# Patient Record
Sex: Female | Born: 1960 | Race: White | Hispanic: No | Marital: Married | Smoking: Never smoker
Health system: Southern US, Community
[De-identification: ages and names within clinical notes are randomized; demographics above are authoritative.]

## PROBLEM LIST (undated history)

## (undated) DIAGNOSIS — E785 Hyperlipidemia, unspecified: Secondary | ICD-10-CM

## (undated) DIAGNOSIS — F329 Major depressive disorder, single episode, unspecified: Secondary | ICD-10-CM

## (undated) DIAGNOSIS — F419 Anxiety disorder, unspecified: Secondary | ICD-10-CM

## (undated) DIAGNOSIS — I10 Essential (primary) hypertension: Secondary | ICD-10-CM

## (undated) DIAGNOSIS — F32A Depression, unspecified: Secondary | ICD-10-CM

## (undated) HISTORY — DX: Depression, unspecified: F32.A

## (undated) HISTORY — DX: Hyperlipidemia, unspecified: E78.5

## (undated) HISTORY — DX: Essential (primary) hypertension: I10

## (undated) HISTORY — DX: Anxiety disorder, unspecified: F41.9

## (undated) HISTORY — DX: Major depressive disorder, single episode, unspecified: F32.9

---

## 1984-10-03 HISTORY — PX: TUBAL LIGATION: SHX77

## 1986-10-03 HISTORY — PX: ABDOMINAL HYSTERECTOMY: SHX81

## 2005-08-03 ENCOUNTER — Ambulatory Visit: Payer: Self-pay | Admitting: Family Medicine

## 2006-08-07 ENCOUNTER — Ambulatory Visit: Payer: Self-pay | Admitting: Family Medicine

## 2007-11-13 ENCOUNTER — Ambulatory Visit: Payer: Self-pay | Admitting: Family Medicine

## 2007-11-13 ENCOUNTER — Ambulatory Visit: Payer: Self-pay | Admitting: Gastroenterology

## 2007-12-31 ENCOUNTER — Ambulatory Visit: Payer: Self-pay | Admitting: Gastroenterology

## 2009-01-07 ENCOUNTER — Ambulatory Visit: Payer: Self-pay | Admitting: Family Medicine

## 2011-02-15 NOTE — Assessment & Plan Note (Signed)
[facility]                         GASTROENTEROLOGY OFFICE NOTE   MATRACA, HUNKINS                         MRN:          [id number]  DATE:11/13/2007                            DOB:          Jun 15, 1961    REFERRING PHYSICIAN:  [name]   REASON FOR CONSULTATION:  Family history of colorectal CA.   HPI:  Katie [name] is a pleasant, 50 year old white female, referred through  the courtesy of Dr. Burnett Sheng for evaluation.  Family history is pertinent  for her father, who developed colon cancer.  She last underwent  screening colonoscopy in 2003, which was entirely normal.  Katie [name] has  no GI complaints, including change in bowel habits, abdominal pain,  melena or hematochezia.  Lab is pertinent for a microcytic anemia.  On  October 11, 2007, her hemoglobin was 11, MCV 80.5.  She denies melena or  hematochezia or vaginal blood loss.  She underwent hysterectomy 20 years  ago.  She denies use of nonsteroidals or aspirin.   PAST MEDICAL HISTORY:  Pertinent for hypertension and depression.  She  is status post hysterectomy and tubal ligation.   FAMILY HISTORY:  As stated above.   MEDICATIONS:  Include Caduet and Effexor.   ALLERGIES:  She has no allergies.   SOCIAL HISTORY:  She neither smokes nor drinks.  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   REVIEW OF SYSTEMS:  Negative.   PHYSICAL EXAMINATION:  She is a healthy-appearing female.  Pulse 80, blood pressure 110/74, weight 142.  HEENT: EOMI.  PERRLA.  Sclerae are anicteric.  Conjunctivae are pink.  NECK:  Supple without thyromegaly, adenopathy or carotid bruits.  CHEST:  Clear to auscultation and percussion without adventitious  sounds.  CARDIAC:  Regular rhythm; normal S1 S2.  There are no murmurs, gallops  or rubs.  ABDOMEN:  Bowel sounds are normoactive.  Abdomen is soft, nontender and  nondistended.  There are no abdominal masses, tenderness, splenic  enlargement or hepatomegaly.  EXTREMITIES:  Full range of motion.  No cyanosis, clubbing or edema.  RECTAL:  Deferred.   IMPRESSION:  1. Family history of colon cancer.  2. Microcytic anemia, probably iron deficiency.   RECOMMENDATION:  1. Colonoscopy.  2. If no source for GI bleeding is seen by colonoscopy, I will obtain      stool Hemoccults.     Barbette Hair. Arlyce Dice, MD,FACG  Electronically Signed    RDK/MedQ  DD: 11/13/2007  DT: 11/14/2007  Job #: [id number]   cc:   [name]

## 2011-02-15 NOTE — Letter (Signed)
November 13, 2007    [name]  [address] [place]  [place], [place] [address]   RE:  Katie [name], Katie [name]  MRN:  [id number]  /  DOB:  [birth year]   Dear Dr. Burnett Sheng:   Upon your kind referral, I had the pleasure of evaluating your patient  and I am pleased to offer my findings.  I saw Katie [name] in the office  today.  Enclosed is a copy of my progress note that details my findings  and recommendations.   Thank you for the opportunity to participate in your patient's care.    Sincerely,      Barbette Hair. Arlyce Dice, MD,FACG  Electronically Signed    RDK/MedQ  DD: 11/13/2007  DT: 11/14/2007  Job #: [id number]

## 2011-04-04 ENCOUNTER — Ambulatory Visit: Payer: Self-pay | Admitting: Family Medicine

## 2012-10-01 ENCOUNTER — Ambulatory Visit: Payer: Self-pay | Admitting: Family Medicine

## 2012-10-04 ENCOUNTER — Ambulatory Visit: Payer: Self-pay | Admitting: Family Medicine

## 2012-11-13 ENCOUNTER — Encounter: Payer: Self-pay | Admitting: Gastroenterology

## 2013-01-22 ENCOUNTER — Encounter: Payer: Self-pay | Admitting: Gastroenterology

## 2013-02-21 ENCOUNTER — Encounter: Payer: Self-pay | Admitting: Gastroenterology

## 2013-02-21 ENCOUNTER — Ambulatory Visit (AMBULATORY_SURGERY_CENTER): Payer: BC Managed Care – PPO | Admitting: *Deleted

## 2013-02-21 VITALS — Ht 65.0 in | Wt 156.2 lb

## 2013-02-21 DIAGNOSIS — Z1211 Encounter for screening for malignant neoplasm of colon: Secondary | ICD-10-CM

## 2013-02-21 MED ORDER — NA SULFATE-K SULFATE-MG SULF 17.5-3.13-1.6 GM/177ML PO SOLN: ORAL | Status: DC

## 2013-03-07 ENCOUNTER — Ambulatory Visit (AMBULATORY_SURGERY_CENTER): Payer: BC Managed Care – PPO | Admitting: Gastroenterology

## 2013-03-07 ENCOUNTER — Encounter: Payer: Self-pay | Admitting: Gastroenterology

## 2013-03-07 VITALS — BP 115/70 | HR 77 | Temp 98.1°F | Resp 17 | Ht 65.0 in | Wt 156.0 lb

## 2013-03-07 DIAGNOSIS — K5730 Diverticulosis of large intestine without perforation or abscess without bleeding: Secondary | ICD-10-CM

## 2013-03-07 DIAGNOSIS — Z800 Family history of malignant neoplasm of digestive organs: Secondary | ICD-10-CM

## 2013-03-07 DIAGNOSIS — Z1211 Encounter for screening for malignant neoplasm of colon: Secondary | ICD-10-CM

## 2013-03-07 MED ORDER — SODIUM CHLORIDE 0.9 % IV SOLN: 500.0000 mL | INTRAVENOUS | Status: DC

## 2013-03-07 NOTE — Op Note (Signed)
[facility] [address] [place], [address]   COLONOSCOPY PROCEDURE REPORT  PATIENT: [name], [name]  MR#: [id number] BIRTHDATE: 02-Nov-1960 , 52  yrs. old GENDER: Female ENDOSCOPIST: Louis Meckel, MD REFERRED XB:JYNWG Hedrick, BoydD. PROCEDURE DATE:  03/07/2013 PROCEDURE:   Colonoscopy, diagnostic ASA CLASS:   Class II INDICATIONS:Patient's immediate family history of colon cancer. MEDICATIONS: MAC sedation, administered by CRNA and propofol (Diprivan) 250mg  IV  DESCRIPTION OF PROCEDURE:   After the risks benefits and alternatives of the procedure were thoroughly explained, informed consent was obtained.  A digital rectal exam revealed no abnormalities of the rectum.   The LB [id number]  endoscope was introduced through the anus and advanced to the cecum, which was identified by both the appendix and ileocecal valve. No adverse events experienced.   The quality of the prep was Suprep good  The instrument was then slowly withdrawn as the colon was fully examined.      COLON FINDINGS: There was moderate diverticulosis noted in the sigmoid colon with associated angulation and muscular hypertrophy. The colon mucosa was otherwise normal.  Retroflexed views revealed no abnormalities. The time to cecum=6 minutes 12 seconds. Withdrawal time=7 minutes 15 seconds.  The scope was withdrawn and the procedure completed. COMPLICATIONS: There were no complications.  ENDOSCOPIC IMPRESSION: 1.   There was moderate diverticulosis noted in the sigmoid colon 2.   The colon mucosa was otherwise normal  RECOMMENDATIONS: Given your significant family history of colon cancer, you should have a repeat colonoscopy in 5 years   eSigned:  Louis Meckel, MD 03/07/2013 10:20 AM   cc:

## 2013-03-07 NOTE — Patient Instructions (Addendum)
Discharge instructions given with verbal understanding. Handouts on diverticulosis and a high fiber diet given. Resume previous medications.YOU HAD AN ENDOSCOPIC PROCEDURE TODAY AT THE [facility]: Refer to the procedure report that was given to you for any specific questions about what was found during the examination.  If the procedure report does not answer your questions, please call your gastroenterologist to clarify.  If you requested that your care partner not be given the details of your procedure findings, then the procedure report has been included in a sealed envelope for you to review at your convenience later.  YOU SHOULD EXPECT: Some feelings of bloating in the abdomen. Passage of more gas than usual.  Walking can help get rid of the air that was put into your GI tract during the procedure and reduce the bloating. If you had a lower endoscopy (such as a colonoscopy or flexible sigmoidoscopy) you Fahl notice spotting of blood in your stool or on the toilet paper. If you underwent a bowel prep for your procedure, then you Welp not have a normal bowel movement for a few days.  DIET: Your first meal following the procedure should be a light meal and then it is ok to progress to your normal diet.  A half-sandwich or bowl of soup is an example of a good first meal.  Heavy or fried foods are harder to digest and [place] make you feel nauseous or bloated.  Likewise meals heavy in dairy and vegetables can cause extra gas to form and this can also increase the bloating.  Drink plenty of fluids but you should avoid alcoholic beverages for 24 hours.  ACTIVITY: Your care partner should take you home directly after the procedure.  You should plan to take it easy, moving slowly for the rest of the day.  You can resume normal activity the day after the procedure however you should NOT DRIVE or use heavy machinery for 24 hours (because of the sedation medicines used during the test).    SYMPTOMS TO  REPORT IMMEDIATELY: A gastroenterologist can be reached at any hour.  During normal business hours, 8:30 AM to 5:00 PM Monday through Friday, call [phone].  After hours and on weekends, please call the GI answering service at [phone] who will take a message and have the physician on call contact you.   Following lower endoscopy (colonoscopy or flexible sigmoidoscopy):  Excessive amounts of blood in the stool  Significant tenderness or worsening of abdominal pains  Swelling of the abdomen that is new, acute  Fever of 100F or higher  FOLLOW UP: If any biopsies were taken you will be contacted by phone or by letter within the next 1-3 weeks.  Call your gastroenterologist if you have not heard about the biopsies in 3 weeks.  Our staff will call the home number listed on your records the next business day following your procedure to check on you and address any questions or concerns that you [place] have at that time regarding the information given to you following your procedure. This is a courtesy call and so if there is no answer at the home number and we have not heard from you through the emergency physician on call, we will assume that you have returned to your regular daily activities without incident.  SIGNATURES/CONFIDENTIALITY: You and/or your care partner have signed paperwork which will be entered into your electronic medical record.  These signatures attest to the fact that that the information above on your   After Visit Summary has been reviewed and is understood.  Full responsibility of the confidentiality of this discharge information lies with you and/or your care-partner.   

## 2013-03-07 NOTE — Progress Notes (Signed)
Lidocaine-40mg IV prior to Propofol InductionPropofol given over incremental dosages 

## 2013-03-07 NOTE — Progress Notes (Signed)
Patient did not experience any of the following events: a burn prior to discharge; a fall within the facility; wrong site/side/patient/procedure/implant event; or a hospital transfer or hospital admission upon discharge from the facility. (G8907) Patient did not have preoperative order for IV antibiotic SSI prophylaxis. (G8918)  

## 2013-03-08 ENCOUNTER — Telehealth: Payer: Self-pay

## 2013-03-08 NOTE — Telephone Encounter (Signed)
Left message to call [facility] if any problems post procedure

## 2013-03-08 NOTE — Telephone Encounter (Deleted)
No answer, left message to call [facility] if any problems post procedure

## 2013-12-09 ENCOUNTER — Ambulatory Visit: Payer: Self-pay | Admitting: Family Medicine

## 2015-03-17 ENCOUNTER — Other Ambulatory Visit: Payer: Self-pay | Admitting: Family Medicine

## 2015-03-17 DIAGNOSIS — Z1231 Encounter for screening mammogram for malignant neoplasm of breast: Secondary | ICD-10-CM

## 2015-03-19 ENCOUNTER — Ambulatory Visit
Admission: RE | Admit: 2015-03-19 | Discharge: 2015-03-19 | Disposition: A | Discharge status: Home / Self Care | Payer: BLUE CROSS/BLUE SHIELD | Source: Ambulatory Visit | Attending: Family Medicine | Admitting: Family Medicine

## 2015-03-19 DIAGNOSIS — Z1231 Encounter for screening mammogram for malignant neoplasm of breast: Secondary | ICD-10-CM

## 2015-07-02 ENCOUNTER — Ambulatory Visit: Payer: Self-pay | Admitting: Family

## 2015-07-02 ENCOUNTER — Encounter: Payer: Self-pay | Admitting: Family

## 2015-07-02 VITALS — BP 122/80 | HR 88 | Temp 97.9°F

## 2015-07-02 DIAGNOSIS — M25579 Pain in unspecified ankle and joints of unspecified foot: Secondary | ICD-10-CM

## 2015-07-02 NOTE — Progress Notes (Signed)
   Subjective:    Patient ID: Katie [name], female    DOB: Huron 05, 1962, 54 y.o.   MRN: [id number]  Ankle Pain  The incident occurred 2 days ago. The incident occurred at home. There was no injury mechanism. The pain is present in the left ankle. The quality of the pain is described as aching. The pain is mild. The pain has been constant since onset. Associated symptoms include an inability to bear weight. Pertinent negatives include no loss of motion, loss of sensation, numbness or tingling. She reports no foreign bodies present. The symptoms are aggravated by weight bearing. She has tried NSAIDs for the symptoms. The treatment provided no relief.      Review of Systems  Constitutional: Negative for fever, chills, fatigue and unexpected weight change.  Musculoskeletal: Negative for myalgias, joint swelling, arthralgias and gait problem.  Neurological: Negative for tingling and numbness.       Objective:   Physical Exam  Constitutional: She is oriented to person, place, and time. She appears well-developed and well-nourished. No distress.  HENT:  Head: Normocephalic.  Cardiovascular: Normal rate.   Pulmonary/Chest: Effort normal.  Musculoskeletal: Normal range of motion.       Left ankle: She exhibits swelling. She exhibits normal range of motion and no deformity. Tenderness (plantar mid foot). Lateral malleolus tenderness found. Achilles tendon normal.  Neurological: She is alert and oriented to person, place, and time.  Skin: Skin is warm and dry.  Psychiatric: She has a normal mood and affect.  Nursing note and vitals reviewed.         Assessment & Plan:  Pain in joint, ankle and foot, unspecified laterality fitted with orthotic shoe . Advised. to take otc motrin to = 600 mg to 800 mg  Bid to tid with meals.Ice . Elevate..follow up prn not improving

## 2015-07-06 ENCOUNTER — Other Ambulatory Visit: Payer: Self-pay | Admitting: Emergency Medicine

## 2015-07-06 NOTE — Telephone Encounter (Signed)
Med request by [facility].  Please advise.  Thank you

## 2015-11-25 ENCOUNTER — Other Ambulatory Visit: Payer: Self-pay | Admitting: Emergency Medicine

## 2015-11-25 MED ORDER — LORATADINE 10 MG PO TABS: 10.0000 mg | ORAL_TABLET | Freq: Every day | ORAL | Status: DC

## 2015-11-25 NOTE — Telephone Encounter (Signed)
Received a faxed medication request from [facility].  Please advise.  Thank you.

## 2015-11-25 NOTE — Telephone Encounter (Signed)
Med refill approved 

## 2016-03-24 ENCOUNTER — Other Ambulatory Visit: Payer: Self-pay | Admitting: Family Medicine

## 2016-03-24 DIAGNOSIS — Z1231 Encounter for screening mammogram for malignant neoplasm of breast: Secondary | ICD-10-CM

## 2016-04-14 ENCOUNTER — Ambulatory Visit
Admission: RE | Admit: 2016-04-14 | Discharge: 2016-04-14 | Disposition: A | Discharge status: Home / Self Care | Payer: BLUE CROSS/BLUE SHIELD | Source: Ambulatory Visit | Attending: Family Medicine | Admitting: Family Medicine

## 2016-04-14 ENCOUNTER — Other Ambulatory Visit: Payer: Self-pay | Admitting: Family Medicine

## 2016-04-14 DIAGNOSIS — Z1231 Encounter for screening mammogram for malignant neoplasm of breast: Secondary | ICD-10-CM

## 2016-11-29 DIAGNOSIS — R682 Dry mouth, unspecified: Secondary | ICD-10-CM | POA: Insufficient documentation

## 2016-11-29 DIAGNOSIS — R768 Other specified abnormal immunological findings in serum: Secondary | ICD-10-CM | POA: Insufficient documentation

## 2016-12-12 ENCOUNTER — Ambulatory Visit: Payer: Self-pay | Admitting: Physician Assistant

## 2016-12-12 ENCOUNTER — Encounter: Payer: Self-pay | Admitting: Physician Assistant

## 2016-12-12 VITALS — BP 138/70 | HR 80 | Temp 98.4°F

## 2016-12-12 DIAGNOSIS — H6980 Other specified disorders of Eustachian tube, unspecified ear: Secondary | ICD-10-CM

## 2016-12-12 DIAGNOSIS — L03211 Cellulitis of face: Secondary | ICD-10-CM

## 2016-12-12 MED ORDER — SULFAMETHOXAZOLE-TRIMETHOPRIM 800-160 MG PO TABS: 1.0000 | ORAL_TABLET | Freq: Two times a day (BID) | ORAL | 0 refills | Status: DC

## 2016-12-12 MED ORDER — FLUTICASONE PROPIONATE 50 MCG/ACT NA SUSP: 2.0000 | Freq: Every day | NASAL | 6 refills | Status: DC

## 2016-12-12 NOTE — Progress Notes (Signed)
S:  C/o ears popping and being stopped up, sharp pain, no drainage from ears, no fever/chills, no cough or congestion, some sinus pressure, has a red area on her forehead that started as a pimple, tried to push out pus and used acne meds without relief, remainder ros neg Using otc meds without relief  O:  Vitals wnl, nad, tms dull b/l, nasal mucosa swollen, throat wnl, neck supple no lymph, lungs c t a, cv rrr, neuro intact, skin with reddened tender area on forehead, no drainage, n/v intact  A: acute eustachean tube dysfunction, cellulitis  P: flonase, septra,  return if not improving in 3 to 5 days, return earlier if worsening

## 2017-05-25 ENCOUNTER — Other Ambulatory Visit: Payer: Self-pay | Admitting: Family Medicine

## 2017-05-25 DIAGNOSIS — G4452 New daily persistent headache (NDPH): Secondary | ICD-10-CM

## 2017-05-25 DIAGNOSIS — Z1231 Encounter for screening mammogram for malignant neoplasm of breast: Secondary | ICD-10-CM

## 2017-06-22 ENCOUNTER — Ambulatory Visit
Admission: RE | Admit: 2017-06-22 | Discharge: 2017-06-22 | Disposition: A | Discharge status: Home / Self Care | Payer: BLUE CROSS/BLUE SHIELD | Source: Ambulatory Visit | Attending: Family Medicine | Admitting: Family Medicine

## 2017-06-22 DIAGNOSIS — Z1231 Encounter for screening mammogram for malignant neoplasm of breast: Secondary | ICD-10-CM

## 2017-11-13 ENCOUNTER — Ambulatory Visit: Payer: Self-pay | Admitting: Family Medicine

## 2017-11-13 ENCOUNTER — Ambulatory Visit
Admission: RE | Admit: 2017-11-13 | Discharge: 2017-11-13 | Disposition: A | Discharge status: Home / Self Care | Payer: BLUE CROSS/BLUE SHIELD | Source: Ambulatory Visit | Attending: Family Medicine | Admitting: Family Medicine

## 2017-11-13 ENCOUNTER — Encounter: Payer: Self-pay | Admitting: Family Medicine

## 2017-11-13 VITALS — BP 147/78 | HR 90 | Temp 99.0°F | Resp 17

## 2017-11-13 DIAGNOSIS — R05 Cough: Secondary | ICD-10-CM

## 2017-11-13 DIAGNOSIS — J181 Lobar pneumonia, unspecified organism: Secondary | ICD-10-CM

## 2017-11-13 DIAGNOSIS — R918 Other nonspecific abnormal finding of lung field: Secondary | ICD-10-CM | POA: Insufficient documentation

## 2017-11-13 DIAGNOSIS — R0602 Shortness of breath: Secondary | ICD-10-CM | POA: Diagnosis present

## 2017-11-13 DIAGNOSIS — R0902 Hypoxemia: Secondary | ICD-10-CM

## 2017-11-13 DIAGNOSIS — R059 Cough, unspecified: Secondary | ICD-10-CM

## 2017-11-13 DIAGNOSIS — J189 Pneumonia, unspecified organism: Secondary | ICD-10-CM

## 2017-11-13 MED ORDER — ALBUTEROL SULFATE HFA 108 (90 BASE) MCG/ACT IN AERS: 2.0000 | INHALATION_SPRAY | RESPIRATORY_TRACT | 1 refills | Status: DC | PRN

## 2017-11-13 MED ORDER — LEVOFLOXACIN 500 MG PO TABS: 500.0000 mg | ORAL_TABLET | Freq: Every day | ORAL | 0 refills | Status: DC

## 2017-11-13 NOTE — Progress Notes (Signed)
Patient ID: Katie [name], female    DOB: 1961/03/10  Age: 57 y.o. MRN: [id number]  Chief Complaint  Patient presents with  . Cough  . URI    Subjective:   57 year old lady who is here with history of 5 days of having a cough.  It initially developed as a cough and chest congestion.  She did not have much upper respiratory symptoms.  She got feeling worse over the weekend.  She has been taking some OTC Mucinex DM and some other cough syrup.  She broke out in hives after taking other cough syrup.  She does not smoke.  She has had bronchitis in the past, never pneumonia.  Has felt tight in her chest last night and today but not frankly wheezing.  She has a husband but he has not yet been sick.  She did have her flu shot this year.  She has not had any ear pain.  Minimal nasal congestion.  No sore throat.  Had to sit up to breathe during the night.  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  Current allergies, medications, problem list, past/family and social histories reviewed.  Objective:  BP (!) 147/78   Pulse 90   Temp 99 F (37.2 C) (Oral)   Resp 17   SpO2 93%   Visibly short of breath when she is talking.  TMs normal.  Skin warm and dry.  Throat clear.  Neck supple without nodes.  Chest has shallow respirations.  A few mild crackles at the bases.  No wheezes.  Heart regular without murmurs.  She is not cyanotic appearing.  O2 saturation is a little low at 93.  Over the last couple of years O2 saturations appear to have run in the 97 and 98 range.  Assessment & Plan:   Assessment: 1. Cough   2. Shortness of breath   3. Hypoxemia   4. Pneumonia of left lower lobe due to infectious organism (HCC)   5. Lingular pneumonia       Plan: DuoNeb, then check O2 sat and relisten to the lungs.  She will need a chest x-ray and CBC.  Exam post treatment still had poor air movement, with rales more on the left lower lobe than the right.  Her O2 saturation was 94%.  When I rechecked it it was 95, then  we ambulated her in the hallway and it went up as high as 98%.  Will send for chest x-ray.  Orders Placed This Encounter  Procedures  . DG Chest 2 View    Standing Status:   Future    Number of Occurrences:   1    Standing Expiration Date:   11/13/2018    Order Specific Question:   Reason for Exam (SYMPTOM  OR DIAGNOSIS REQUIRED)    Answer:   cough, tight breathing, O2 Sat 93%, r/o pneumonia    Order Specific Question:   Is the patient pregnant?    Answer:   No    Order Specific Question:   Preferred imaging location?    Answer:   [facility]  . CBC    No orders of the defined types were placed in this encounter.  Chest x-ray:  IMPRESSION: Patchy opacification over the lingula which Vandenboom be due to atelectasis or infection.   The x-ray actually shows a huge stomach air bubble which Ou very well be adding to the atelectasis appearance.  However I am going to treat her as if this is an  early pneumonia which appears to be possibly lingula, possible for the lobe.  The heart border looks clean.      Patient Instructions  Drink lots of fluids and get enough rest  Take Tylenol or ibuprofen as needed for fever  Take Levaquin antibiotic 500 mg 1 daily for pneumonia  Take plain Mucinex to try and thin the secretions.  If you are coughing a lot you can take the DM-containing medication.  If short of breath or getting sicker go to the emergency room.  If stable but not improving dramatically by Wednesday or Thursday come back for recheck.    Return in about 2 days (around 11/15/2017), or if symptoms worsen or fail to improve.   Mitsugi Schrader, MD 11/13/2017

## 2017-11-13 NOTE — Patient Instructions (Signed)
Drink lots of fluids and get enough rest  Take Tylenol or ibuprofen as needed for fever  Take Levaquin antibiotic 500 mg 1 daily for pneumonia  Take plain Mucinex to try and thin the secretions.  If you are coughing a lot you can take the DM-containing medication.  If short of breath or getting sicker go to the emergency room.  If stable but not improving dramatically by Wednesday or Thursday come back for recheck.

## 2017-11-14 LAB — CBC
Hematocrit: 41.5 % (ref 34.0–46.6)
Hemoglobin: 14.2 g/dL (ref 11.1–15.9)
MCH: 31.3 pg (ref 26.6–33.0)
MCHC: 34.2 g/dL (ref 31.5–35.7)
MCV: 91 fL (ref 79–97)
Platelets: 260 10*3/uL (ref 150–379)
RBC: 4.54 x10E6/uL (ref 3.77–5.28)
RDW: 13.5 % (ref 12.3–15.4)
WBC: 6.3 10*3/uL (ref 3.4–10.8)

## 2017-12-08 ENCOUNTER — Ambulatory Visit: Payer: Self-pay | Admitting: Family Medicine

## 2017-12-08 ENCOUNTER — Encounter: Payer: Self-pay | Admitting: Family Medicine

## 2017-12-08 VITALS — BP 130/80 | HR 96 | Temp 98.4°F | Resp 18

## 2017-12-08 DIAGNOSIS — J069 Acute upper respiratory infection, unspecified: Secondary | ICD-10-CM

## 2017-12-08 NOTE — Progress Notes (Signed)
Subjective: Congestion     Katie HoesDebbie Dodd [name] is a 57 y.o. female who presents for evaluation of nasal congestion, runny nose, headache, sore throat, cough, bilateral ear pressure since Wednesday.  Patient reports the cough is very mild and nonproductive.  Patient describes nasal discharge is clear with some yellow.  Last month patient was diagnosed with pneumonia and treated with Levaquin.  Patient's symptoms completely subsided and patient was doing very well post treatment.  Patient reports that her current symptoms feel very different from when she had the pneumonia and that her symptoms are much milder.  Treatment attempted at home: Tylenol cold and sinus.  Denies rash, nausea, vomiting, diarrhea, shortness of breath, wheezing, chest or back pain, difficulty swallowing, confusion, facial pressure, body aches, fatigue, fever, chills, severe symptoms, or worsening of symptoms. History of smoking, asthma, COPD: negative History of recurrent sinus and/or lung infections: negative .  Review of Systems Pertinent items noted in HPI and remainder of comprehensive ROS otherwise negative.     Objective:   Physical Exam General: Awake, alert, and oriented. No acute distress. Well developed, hydrated and nourished. Appears stated age. Nontoxic appearance.  HEENT:  PND noted.  Mild erythema to posterior oropharynx.  No edema or exudates of pharynx or tonsils. No erythema or bulging of TM.  Mild erythema/edema to nasal mucosa. Sinuses nontender other than mild bilateral maxillary tenderness. Supple neck without adenopathy. Cardiac: Heart rate and rhythm are normal. No murmurs, gallops, or rubs are auscultated. S1 and S2 are heard and are of normal intensity.  Respiratory: No signs of respiratory distress. Lungs clear. No tachypnea. Able to speak in full sentences without dyspnea. Nonlabored respirations.  Skin: Skin is warm, dry and intact. Appropriate color for ethnicity. No cyanosis noted.   Diagnostic  Results: None.  Assessment:    viral upper respiratory illness   Plan:    Discussed diagnosis and treatment of URI. Discussed the importance of avoiding unnecessary antibiotic therapy. Suggested symptomatic OTC remedies. Nasal saline spray for congestion.  Discussed red flag symptoms and circumstances with which to return to care.   New Prescriptions   No medications on file

## 2018-02-21 ENCOUNTER — Encounter: Payer: Self-pay | Admitting: Gastroenterology

## 2018-06-29 ENCOUNTER — Other Ambulatory Visit: Payer: Self-pay | Admitting: Family Medicine

## 2018-06-29 DIAGNOSIS — Z1231 Encounter for screening mammogram for malignant neoplasm of breast: Secondary | ICD-10-CM

## 2018-07-13 ENCOUNTER — Encounter: Payer: Self-pay | Admitting: Emergency Medicine

## 2018-07-13 ENCOUNTER — Ambulatory Visit: Payer: Self-pay | Admitting: Emergency Medicine

## 2018-07-13 VITALS — BP 122/78 | HR 92 | Temp 98.6°F | Resp 14

## 2018-07-13 DIAGNOSIS — T7840XA Allergy, unspecified, initial encounter: Secondary | ICD-10-CM | POA: Insufficient documentation

## 2018-07-13 DIAGNOSIS — F419 Anxiety disorder, unspecified: Secondary | ICD-10-CM | POA: Insufficient documentation

## 2018-07-13 DIAGNOSIS — K649 Unspecified hemorrhoids: Secondary | ICD-10-CM | POA: Insufficient documentation

## 2018-07-13 DIAGNOSIS — D649 Anemia, unspecified: Secondary | ICD-10-CM | POA: Insufficient documentation

## 2018-07-13 DIAGNOSIS — A6000 Herpesviral infection of urogenital system, unspecified: Secondary | ICD-10-CM | POA: Insufficient documentation

## 2018-07-13 DIAGNOSIS — I10 Essential (primary) hypertension: Secondary | ICD-10-CM | POA: Insufficient documentation

## 2018-07-13 DIAGNOSIS — B9789 Other viral agents as the cause of diseases classified elsewhere: Principal | ICD-10-CM

## 2018-07-13 DIAGNOSIS — E785 Hyperlipidemia, unspecified: Secondary | ICD-10-CM | POA: Insufficient documentation

## 2018-07-13 DIAGNOSIS — J069 Acute upper respiratory infection, unspecified: Secondary | ICD-10-CM

## 2018-07-13 NOTE — Patient Instructions (Addendum)
You have a viral upper respiratory infection.  No sign of bacterial infection.  No sign of bronchitis or pneumonia.  Antibiotic is not indicated. Treatment: Rest, push fluids.  Okay to take OTC Robitussin or Delsym for cough.  Okay to continue Flonase and Mucinex  (do not take Mucinex D or Sudafed or anything with a decongestant, as that could raise your blood pressure) Follow-up here or with your PCP if no better in 1 week, sooner if worse.  (If any severe symptoms, go to emergency room.) Please see instruction sheets below.  Upper Respiratory Infection, Adult Most upper respiratory infections (URIs) are a viral infection of the air passages leading to the lungs. A URI affects the nose, throat, and upper air passages. The most common type of URI is nasopharyngitis and is typically referred to as "the common cold." URIs run their course and usually go away on their own. Most of the time, a URI does not require medical attention, but sometimes a bacterial infection in the upper airways can follow a viral infection. This is called a secondary infection. Sinus and middle ear infections are common types of secondary upper respiratory infections. Bacterial pneumonia can also complicate a URI. A URI can worsen asthma and chronic obstructive pulmonary disease (COPD). Sometimes, these complications can require emergency medical care and [place] be life threatening. What are the causes? Almost all URIs are caused by viruses. A virus is a type of germ and can spread from one person to another. What increases the risk? You Albus be at risk for a URI if:  You smoke.  You have chronic heart or lung disease.  You have a weakened defense (immune) system.  You are very young or very old.  You have nasal allergies or asthma.  You work in crowded or poorly ventilated areas.  You work in health care facilities or schools.  What are the signs or symptoms? Symptoms typically develop 2-3 days after you come in  contact with a cold virus. Most viral URIs last 7-10 days. However, viral URIs from the influenza virus (flu virus) can last 14-18 days and are typically more severe. Symptoms Rodriguez include:  Runny or stuffy (congested) nose.  Sneezing.  Cough.  Sore throat.  Headache.  Fatigue.  Fever.  Loss of appetite.  Pain in your forehead, behind your eyes, and over your cheekbones (sinus pain).  Muscle aches.  How is this diagnosed? Your health care provider Rosander diagnose a URI by:  Physical exam.  Tests to check that your symptoms are not due to another condition such as: ? Strep throat. ? Sinusitis. ? Pneumonia. ? Asthma.  How is this treated? A URI goes away on its own with time. It cannot be cured with medicines, but medicines Dacanay be prescribed or recommended to relieve symptoms. Medicines [place] help:  Reduce your fever.  Reduce your cough.  Relieve nasal congestion.  Follow these instructions at home:  Take medicines only as directed by your health care provider.  Gargle warm saltwater or take cough drops to comfort your throat as directed by your health care provider.  Use a warm mist humidifier or inhale steam from a shower to increase air moisture. This Mould make it easier to breathe.  Drink enough fluid to keep your urine clear or pale yellow.  Eat soups and other clear broths and maintain good nutrition.  Rest as needed.  Return to work when your temperature has returned to normal or as your health care provider advises.  You Perdomo need to stay home longer to avoid infecting others. You can also use a face mask and careful hand washing to prevent spread of the virus.  Increase the usage of your inhaler if you have asthma.  Do not use any tobacco products, including cigarettes, chewing tobacco, or electronic cigarettes. If you need help quitting, ask your health care provider. How is this prevented? The best way to protect yourself from getting a cold is to  practice good hygiene.  Avoid oral or hand contact with people with cold symptoms.  Wash your hands often if contact occurs.  There is no clear evidence that vitamin C, vitamin E, echinacea, or exercise reduces the chance of developing a cold. However, it is always recommended to get plenty of rest, exercise, and practice good nutrition. Contact a health care provider if:  You are getting worse rather than better.  Your symptoms are not controlled by medicine.  You have chills.  You have worsening shortness of breath.  You have brown or red mucus.  You have yellow or brown nasal discharge.  You have pain in your face, especially when you bend forward.  You have a fever.  You have swollen neck glands.  You have pain while swallowing.  You have white areas in the back of your throat. Get help right away if:  You have severe or persistent: ? Headache. ? Ear pain. ? Sinus pain. ? Chest pain.  You have chronic lung disease and any of the following: ? Wheezing. ? Prolonged cough. ? Coughing up blood. ? A change in your usual mucus.  You have a stiff neck.  You have changes in your: ? Vision. ? Hearing. ? Thinking. ? Mood. This information is not intended to replace advice given to you by your health care provider. Make sure you discuss any questions you have with your health care provider. Document Released: 03/15/2001 Document Revised: 05/22/2016 Document Reviewed: 12/25/2013 Elsevier Interactive Patient Education  [employer].

## 2018-07-13 NOTE — Progress Notes (Signed)
URI HISTORY  Katie [name] is a 57 y.o. female who complains of onset of cold symptoms for 3 days.  Have been using over-the-counter treatment, just started taking Mucinex and using Flonase yesterday, which helps a little bit.  No chills/sweats No fever  No nasal congestion No discolored Post-nasal drainage No sinus pain/pressure No sore throat  + Mild, nonproductive cough No wheezing No chest congestion No hemoptysis No shortness of breath No pleuritic pain  No itchy/red eyes No earache  No nausea No vomiting No abdominal pain No diarrhea  No skin rashes +  Fatigue No myalgias No headache   Objective: No distress.  Pleasant female.  No cough noted during exam. Blood pressure 122/78, pulse 92 and regular, oxygen saturation 96% on room air, temp 98.6, respiratory rate 14 TMs normal Nose, boggy turbinates, slight seromucoid drainage No facial tenderness Pharynx: Clear.  No redness or exudate. Neck: Supple no adenopathy Lungs, clear to auscultation.  No rales or wheezes or rhonchi Heart: Regular rate and rhythm without murmur Extremities without cyanosis clubbing or edema Skin, no rash  Assessment:   Viral URI  Plans: Discussed with patient OTC and symptomatic care. Verbal instructions and printed AVS given to patient: You have a viral upper respiratory infection.  No sign of bacterial infection.  No sign of bronchitis or pneumonia.  Antibiotic is not indicated. Treatment: Rest, push fluids.  Okay to take OTC Robitussin or Delsym for cough.  Okay to continue Flonase and Mucinex  (do not take Mucinex D or Sudafed or anything with a decongestant, as that could raise your blood pressure) Follow-up here or with your PCP if no better in 1 week, sooner if worse.  (If any severe symptoms, go to emergency room.) Please see instruction sheets below.

## 2018-07-24 ENCOUNTER — Ambulatory Visit
Admission: RE | Admit: 2018-07-24 | Discharge: 2018-07-24 | Disposition: A | Discharge status: Home / Self Care | Payer: BLUE CROSS/BLUE SHIELD | Source: Ambulatory Visit | Attending: Family Medicine | Admitting: Family Medicine

## 2018-07-24 DIAGNOSIS — Z1231 Encounter for screening mammogram for malignant neoplasm of breast: Secondary | ICD-10-CM | POA: Diagnosis not present

## 2018-11-26 ENCOUNTER — Ambulatory Visit: Admit: 2018-11-26 | Payer: BLUE CROSS/BLUE SHIELD | Admitting: Unknown Physician Specialty

## 2018-11-26 SURGERY — COLONOSCOPY WITH PROPOFOL
Anesthesia: General

## 2019-06-21 ENCOUNTER — Other Ambulatory Visit: Payer: Self-pay | Admitting: Family Medicine

## 2019-06-21 DIAGNOSIS — Z1231 Encounter for screening mammogram for malignant neoplasm of breast: Secondary | ICD-10-CM

## 2019-07-26 ENCOUNTER — Ambulatory Visit
Admission: RE | Admit: 2019-07-26 | Discharge: 2019-07-26 | Disposition: A | Discharge status: Home / Self Care | Payer: BC Managed Care – PPO | Source: Ambulatory Visit | Attending: Family Medicine | Admitting: Family Medicine

## 2019-07-26 ENCOUNTER — Other Ambulatory Visit: Payer: Self-pay

## 2019-07-26 DIAGNOSIS — Z1231 Encounter for screening mammogram for malignant neoplasm of breast: Secondary | ICD-10-CM | POA: Diagnosis not present

## 2019-09-09 ENCOUNTER — Other Ambulatory Visit: Payer: Self-pay

## 2019-09-09 DIAGNOSIS — Z20822 Contact with and (suspected) exposure to covid-19: Secondary | ICD-10-CM

## 2019-09-11 LAB — NOVEL CORONAVIRUS, NAA: SARS-CoV-2, NAA: NOT DETECTED

## 2020-07-03 ENCOUNTER — Other Ambulatory Visit: Payer: Self-pay | Admitting: Family Medicine

## 2020-07-03 DIAGNOSIS — Z1231 Encounter for screening mammogram for malignant neoplasm of breast: Secondary | ICD-10-CM

## 2020-07-29 ENCOUNTER — Other Ambulatory Visit: Payer: Self-pay

## 2020-07-29 ENCOUNTER — Ambulatory Visit
Admission: RE | Admit: 2020-07-29 | Discharge: 2020-07-29 | Disposition: A | Discharge status: Home / Self Care | Payer: BC Managed Care – PPO | Source: Ambulatory Visit | Attending: Family Medicine | Admitting: Family Medicine

## 2020-07-29 DIAGNOSIS — Z1231 Encounter for screening mammogram for malignant neoplasm of breast: Secondary | ICD-10-CM

## 2021-11-12 ENCOUNTER — Other Ambulatory Visit: Payer: Self-pay | Admitting: Family Medicine

## 2021-11-12 DIAGNOSIS — Z1231 Encounter for screening mammogram for malignant neoplasm of breast: Secondary | ICD-10-CM

## 2021-12-17 ENCOUNTER — Ambulatory Visit
Admission: RE | Admit: 2021-12-17 | Discharge: 2021-12-17 | Disposition: A | Discharge status: Home / Self Care | Payer: BC Managed Care – PPO | Source: Ambulatory Visit | Attending: Family Medicine | Admitting: Family Medicine

## 2021-12-17 ENCOUNTER — Other Ambulatory Visit: Payer: Self-pay

## 2021-12-17 DIAGNOSIS — Z1231 Encounter for screening mammogram for malignant neoplasm of breast: Secondary | ICD-10-CM | POA: Diagnosis present

## 2021-12-23 ENCOUNTER — Other Ambulatory Visit: Payer: Self-pay | Admitting: Family Medicine

## 2021-12-23 DIAGNOSIS — R928 Other abnormal and inconclusive findings on diagnostic imaging of breast: Secondary | ICD-10-CM

## 2021-12-23 DIAGNOSIS — N6489 Other specified disorders of breast: Secondary | ICD-10-CM

## 2022-01-14 ENCOUNTER — Ambulatory Visit
Admission: RE | Admit: 2022-01-14 | Discharge: 2022-01-14 | Disposition: A | Discharge status: Home / Self Care | Payer: BC Managed Care – PPO | Source: Ambulatory Visit | Attending: Family Medicine | Admitting: Family Medicine

## 2022-01-14 ENCOUNTER — Other Ambulatory Visit: Payer: BC Managed Care – PPO

## 2022-01-14 DIAGNOSIS — N6489 Other specified disorders of breast: Secondary | ICD-10-CM | POA: Diagnosis present

## 2022-01-14 DIAGNOSIS — R928 Other abnormal and inconclusive findings on diagnostic imaging of breast: Secondary | ICD-10-CM | POA: Diagnosis present

## 2022-01-21 ENCOUNTER — Encounter: Payer: Self-pay | Admitting: Physician Assistant

## 2022-01-21 ENCOUNTER — Ambulatory Visit: Payer: Self-pay | Admitting: Physician Assistant

## 2022-01-21 VITALS — BP 140/78 | HR 96 | Temp 98.7°F | Resp 16 | Ht 64.0 in | Wt 156.0 lb

## 2022-01-21 DIAGNOSIS — J069 Acute upper respiratory infection, unspecified: Secondary | ICD-10-CM

## 2022-01-21 NOTE — Progress Notes (Signed)
? ?  Subjective:  ? ? Patient ID: Katie [name], female    DOB: 1961/09/22, 61 y.o.   MRN: [id number] ? ?Sore Throat  ?This is a new problem. The current episode started in the past 7 days. The problem has been gradually improving. The maximum temperature recorded prior to her arrival was 100.4 - 100.9 F. The fever has been present for Less than 1 day. The pain is moderate. Associated symptoms include congestion, coughing, headaches, a hoarse voice and trouble swallowing. Pertinent negatives include no abdominal pain, shortness of breath or vomiting. She has had no exposure to strep. She has tried acetaminophen for the symptoms. The treatment provided mild relief.  ? ? ? ?Review of Systems  ?Constitutional:  Positive for activity change, appetite change and fever.  ?HENT:  Positive for congestion, hoarse voice, postnasal drip, sinus pressure, sore throat and trouble swallowing.   ?Eyes:  Positive for redness.  ?Respiratory:  Positive for cough. Negative for shortness of breath and wheezing.   ?Cardiovascular:  Negative for chest pain, palpitations and leg swelling.  ?Gastrointestinal:  Negative for abdominal pain and vomiting.  ?Musculoskeletal:  Positive for myalgias.  ?Neurological:  Positive for headaches.  ? ?   ?Objective:  ? Physical Exam ?Constitutional:   ?   General: She is not in acute distress. ?   Appearance: She is not toxic-appearing.  ?HENT:  ?   Nose: Congestion present.  ?   Mouth/Throat:  ?   Mouth: Mucous membranes are moist.  ?   Tonsils: No tonsillar exudate.  ?   Comments: Mild erythema ?Eyes:  ?   Extraocular Movements:  ?   Right eye: Normal extraocular motion.  ?   Left eye: Normal extraocular motion.  ?   Comments: Mild conjunctival redness of the left eye  ?Cardiovascular:  ?   Rate and Rhythm: Normal rate and regular rhythm.  ?   Heart sounds: No murmur heard. ?  No gallop.  ?Pulmonary:  ?   Effort: Pulmonary effort is normal.  ?   Breath sounds: No stridor. No wheezing.  ?   Comments:  Course breath sounds. No stridor. ?Skin: ?   General: Skin is warm.  ?   Capillary Refill: Capillary refill takes less than 2 seconds.  ?Neurological:  ?   Mental Status: She is alert.  ? ? ? ? ? ?   ?Assessment & Plan: ?Assessment: Upper Respiratory Infection  ? ?Plan: Examination and vital signs reviewed with patient. Patient has had a negative COVID19 test, RSV test, and negative strep test. ?The exam suggest upper respiratory infection, probably viral. She reports feeling a little better today, but continues to have sore throat and congestion. ?I discussed the need for increase hydration with water and juices. Salt water gargles for throat comfort. Lots of rest. I would encourage patient to use her mask and wash hands frequently until symptoms have resolved. Use tylenol every 4 hours for fever or headaches. I have ask her to return to this clinic or see her primary MD if any changes in her condition. The opportunity for questions was given. Questions answered. ? ?

## 2022-01-21 NOTE — Patient Instructions (Signed)
Examination and vital signs reviewed with patient. Patient has had a negative COVID19 test, RSV test, and negative strep test. ?The exam suggest upper respiratory infection, probably viral. She reports feeling a little better today, but continues to have sore throat and congestion. ?I discussed the need for increase hydration with water and juices. Salt water gargles for throat comfort. Lots of rest. I would encourage patient to use her mask and wash hands frequently until symptoms have resolved. Use tylenol every 4 hours for fever or headaches. I have ask her to return to this clinic or see her primary MD if any changes in her condition. The opportunity for questions was given. Questions answered. ?

## 2022-02-03 ENCOUNTER — Ambulatory Visit: Payer: Self-pay | Admitting: Physician Assistant

## 2022-02-03 ENCOUNTER — Encounter: Payer: Self-pay | Admitting: Physician Assistant

## 2022-02-03 VITALS — BP 138/80 | HR 76 | Temp 98.0°F | Resp 16 | Ht 65.0 in | Wt 156.0 lb

## 2022-02-03 DIAGNOSIS — J029 Acute pharyngitis, unspecified: Secondary | ICD-10-CM

## 2022-02-03 MED ORDER — PREDNISONE 20 MG PO TABS: 40.0000 mg | ORAL_TABLET | Freq: Every day | ORAL | 0 refills | Status: DC

## 2022-02-03 NOTE — Progress Notes (Signed)
? ?  Subjective:  ? ? Patient ID: Katie [name], female    DOB: [birth year], 61 y.o.   MRN: [id number] ? ?Sore Throat  ?This is a recurrent (Patient reports sore throat since mid April. Worse 5/2.) problem. The current episode started in the past 7 days. The problem has been gradually worsening. Neither side of throat is experiencing more pain than the other. There has been no fever. The pain is moderate. Associated symptoms include congestion, coughing and headaches. Pertinent negatives include no abdominal pain, diarrhea, drooling, ear pain, hoarse voice, neck pain, shortness of breath, stridor, swollen glands, trouble swallowing or vomiting. She has had no exposure to strep. She has tried acetaminophen and gargles for the symptoms. The treatment provided no relief.  ? ? ? ?Review of Systems  ?Constitutional:  Negative for activity change, appetite change, chills, fatigue and fever.  ?HENT:  Positive for congestion. Negative for drooling, ear pain, hoarse voice and trouble swallowing.   ?Eyes:  Negative for photophobia and redness.  ?Respiratory:  Positive for cough. Negative for shortness of breath, wheezing and stridor.   ?Cardiovascular:  Negative for chest pain.  ?Gastrointestinal:  Negative for abdominal pain, diarrhea and vomiting.  ?Musculoskeletal:  Negative for myalgias and neck pain.  ?Skin:  Negative for rash.  ?Neurological:  Positive for headaches.  ? ?   ?Objective:  ? Physical Exam ?Vitals reviewed.  ?Constitutional:   ?   General: She is not in acute distress. ?   Appearance: She is well-developed. She is not ill-appearing.  ?HENT:  ?   Head: Normocephalic.  ?   Right Ear: Tympanic membrane normal.  ?   Left Ear: Tympanic membrane normal.  ?   Nose: Congestion present.  ?   Mouth/Throat:  ?   Mouth: Mucous membranes are moist. No oral lesions.  ?   Pharynx: Oropharynx is clear. Uvula midline. No posterior oropharyngeal erythema.  ?Eyes:  ?   Conjunctiva/sclera: Conjunctivae normal.  ?Cardiovascular:   ?   Rate and Rhythm: Normal rate.  ?   Heart sounds: No murmur heard. ?  No friction rub. No gallop.  ?Pulmonary:  ?   Effort: Pulmonary effort is normal.  ?   Breath sounds: No wheezing.  ?Musculoskeletal:  ?   Cervical back: Normal range of motion.  ?Lymphadenopathy:  ?   Cervical: No cervical adenopathy.  ?Skin: ?   Findings: No rash.  ?Neurological:  ?   Mental Status: She is alert.  ? ? ? ? ? ?   ?Assessment & Plan: ?Assessment - 1. Pharyngitis  2. URI ? ?Plan: I discussed the findings of today's exam with the patient. We discussed negative COVID, RSV, and strep testing 3-4 weeks ago. ?The exam today suggest pharyngitis, probably viral. ?I ask the patient to use tylenol extra strength every 4 hours for fever and sore throat. She is given Rx for prednisone to take daily with food. I encouraged salt water gargles 3 times daily. We discussed the need for increasing fluids. She will use allegra every 12 hours for assistance with congestion. She will return to the clinic if no improvement in the next 7 to 10 days. Opportunity for questions given. Questions answered.  ? ? ?

## 2022-02-03 NOTE — Patient Instructions (Signed)
I discussed the findings of today's exam with the patient. We discussed negative COVID, RSV, and strep testing 3-4 weeks ago. ?The exam today suggest pharyngitis, probably viral. ?I ask the patient to use tylenol extra strength every 4 hours for fever and sore throat. She is given Rx for prednisone to take daily with food. I encouraged salt water gargles 3 times daily. We discussed the need for increasing fluids. She will use allegra every 12 hours for assistance with congestion. She will return to the clinic if no improvement in the next 7 to 10 days. Opportunity for questions given. Questions answered. ?

## 2022-03-14 ENCOUNTER — Ambulatory Visit: Payer: Self-pay | Admitting: Physician Assistant

## 2022-03-14 ENCOUNTER — Encounter: Payer: Self-pay | Admitting: Physician Assistant

## 2022-03-14 VITALS — BP 134/74 | HR 86 | Temp 98.2°F | Resp 16 | Wt 156.0 lb

## 2022-03-14 DIAGNOSIS — H6591 Unspecified nonsuppurative otitis media, right ear: Secondary | ICD-10-CM

## 2022-03-14 MED ORDER — PREDNISONE 20 MG PO TABS: 40.0000 mg | ORAL_TABLET | Freq: Every day | ORAL | 0 refills | Status: AC

## 2022-03-14 MED ORDER — FEXOFENADINE-PSEUDOEPHED ER 60-120 MG PO TB12: 1.0000 | ORAL_TABLET | Freq: Two times a day (BID) | ORAL | 1 refills | Status: AC

## 2022-03-14 NOTE — Progress Notes (Signed)
   Subjective:    Patient ID: Katie [name], female    DOB: Jul 18, 1961, 61 y.o.   MRN: [id number]  Ear Fullness  There is pain in both (Right greater than left) ears. This is a new problem. The current episode started in the past 7 days. The problem occurs constantly. The problem has been gradually worsening. There has been no fever. Pain severity now: No pain, but "feels weird". Associated symptoms include coughing. Pertinent negatives include no abdominal pain, diarrhea, ear discharge, headaches, hearing loss, rash, sore throat or vomiting. 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 The treatment provided no relief. There is no history of a chronic ear infection or hearing loss.      Review of Systems  Constitutional:  Negative for activity change, appetite change, chills and fever.  HENT:  Positive for congestion. Negative for ear discharge, hearing loss and sore throat.   Eyes: Negative.   Respiratory:  Positive for cough. Negative for shortness of breath and wheezing.   Cardiovascular: Negative.   Gastrointestinal:  Negative for abdominal pain, diarrhea and vomiting.  Skin:  Negative for rash.  Neurological:  Negative for headaches.       Objective:   Physical Exam Constitutional:      Appearance: Normal appearance. She is ill-appearing.  HENT:     Head: Normocephalic and atraumatic.     Right Ear: Ear canal and external ear normal.     Left Ear: Ear canal and external ear normal.     Ears:     Comments: Scaring of right and left TM. Mild fluid in right and left. No pus. No bulging. No drainage.    Nose: Congestion present.  Eyes:     Conjunctiva/sclera: Conjunctivae normal.     Pupils: Pupils are equal, round, and reactive to light.  Cardiovascular:     Rate and Rhythm: Normal rate.  Pulmonary:     Effort: Pulmonary effort is normal.  Musculoskeletal:        General: Normal range of motion.     Cervical back: Normal range of motion and neck supple. No tenderness.  Lymphadenopathy:      Cervical: No cervical adenopathy.  Skin:    General: Skin is warm.  Neurological:     Mental Status: She is alert.           Assessment & Plan: Assessment - Otitis Media with effusion  Plan: I have ask the patient to use Allegra D and given Rx for Prednisone for 6 days. Patient [name] also try Zyrtec D if Allegra is not effective. We discussed good hydration. NO complaint of dizziness or related problems. Mild nasal  and sinus congestion, but not fever noted. Patient will return to clinic is any signs of acute infection. The opportunity for questions given. Questions answered.

## 2022-03-14 NOTE — Patient Instructions (Addendum)
The vital signs are within normal limits. The patients main complaint is a sensation of fluid in the ear. I have ask the Katie to use Allegra D and given Rx for Prednisone for 6 days. Katie [name] also try Zyrtec D if Allegra is not effective. We discussed good hydration. NO complaint of dizziness or related problems. Mild nasal  and sinus congestion, but not fever noted. Katie will return to clinic is any signs of acute infection. The opportunity for questions given. Questions answered.

## 2022-10-19 ENCOUNTER — Other Ambulatory Visit: Payer: Self-pay | Admitting: Family Medicine

## 2022-10-19 DIAGNOSIS — N6313 Unspecified lump in the right breast, lower outer quadrant: Secondary | ICD-10-CM

## 2022-10-27 ENCOUNTER — Ambulatory Visit
Admission: RE | Admit: 2022-10-27 | Discharge: 2022-10-27 | Disposition: A | Discharge status: Home / Self Care | Payer: BC Managed Care – PPO | Source: Ambulatory Visit | Attending: Family Medicine | Admitting: Family Medicine

## 2022-10-27 DIAGNOSIS — N6313 Unspecified lump in the right breast, lower outer quadrant: Secondary | ICD-10-CM

## 2022-12-23 ENCOUNTER — Other Ambulatory Visit: Payer: Self-pay | Admitting: Family Medicine

## 2022-12-23 DIAGNOSIS — M5412 Radiculopathy, cervical region: Secondary | ICD-10-CM

## 2023-01-06 ENCOUNTER — Ambulatory Visit
Admission: RE | Admit: 2023-01-06 | Discharge: 2023-01-06 | Disposition: A | Discharge status: Home / Self Care | Payer: BC Managed Care – PPO | Source: Ambulatory Visit | Attending: Family Medicine | Admitting: Family Medicine

## 2023-01-06 DIAGNOSIS — M5412 Radiculopathy, cervical region: Secondary | ICD-10-CM

## 2023-12-07 ENCOUNTER — Other Ambulatory Visit: Payer: Self-pay | Admitting: Family Medicine

## 2023-12-07 DIAGNOSIS — Z1231 Encounter for screening mammogram for malignant neoplasm of breast: Secondary | ICD-10-CM

## 2023-12-21 ENCOUNTER — Ambulatory Visit
Admission: RE | Admit: 2023-12-21 | Discharge: 2023-12-21 | Disposition: A | Discharge status: Home / Self Care | Source: Ambulatory Visit | Attending: Family Medicine | Admitting: Family Medicine

## 2023-12-21 DIAGNOSIS — Z1231 Encounter for screening mammogram for malignant neoplasm of breast: Secondary | ICD-10-CM | POA: Diagnosis present

## 2024-01-11 ENCOUNTER — Encounter: Payer: Self-pay | Admitting: Dietician

## 2024-01-11 ENCOUNTER — Encounter: Attending: Family Medicine | Admitting: Dietician

## 2024-01-11 VITALS — Ht 65.0 in | Wt 169.3 lb

## 2024-01-11 DIAGNOSIS — E663 Overweight: Secondary | ICD-10-CM | POA: Diagnosis not present

## 2024-01-11 NOTE — Progress Notes (Signed)
 Medical Nutrition Therapy: Visit start time: 0930  end time: 1030  Assessment:   Referral Diagnosis: overweight Other medical history/ diagnoses: hypertension, hyperlipidemia Psychosocial issues/ stress concerns: none  Medications, supplements: reconciled list in medical record   Current weight: 169.3lbs Height: 5'5" BMI: 28.17   Progress and evaluation:  Weight has been  stable over past several months; but uncomfortable for patient. She also wants to reduce health risk. Reports "normal" adult weight of 135, a few lbs increase with each year Has been reducing fried foods, tea more water Used to eat sweets but not as much recently Father had diabetes; patient reports occasional symptoms of low blood sugar, typically around lunch time. Food allergies: none known; reports some bloating in evenings Special diet practices: none     Dietary Intake:  Usual eating pattern includes 3 meals and 0-1 snacks per day. Dining out frequency: 4 meals per week. Who plans meals/ buys groceries? self Who prepares meals? 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   Intervention:   Nutrition Care Education:   Basic nutrition: basic food groups; appropriate nutrient balance; appropriate meal and snack schedule; general nutrition guidelines    Weight control: importance of low sugar and low fat choices; portion control strategies; estimated energy needs for weight loss at 1200-1300 kcal, provided guidance for 45% CHO, 25% pro, 30% fat; role of fiber and high  fiber food choices, importance of increasing fiber gradually; inclusion of protein source and small amounts of healthy fats, limiting highly processed foods to help with satiety and minimize fluctuations in blood sugar; increasing physical activity Advanced nutrition:  pre-planning meals; food label reading Hypertension: identifying high sodium foods, identifying food sources of potassium, magnesium Hyperlipidemia:  healthy and unhealthy fats; role of fiber   Other intervention notes: Patient has been working on some positive, appropriate diet changes; she is motivated to continue. Established additional goals for change with direction from patient.   Nutritional Diagnosis:  [place]-3.3 Overweight/obesity As related to history of excess calories; inadequate physical activity.  As evidenced by patient with current BMI of 28, making diet changes to promote weight loss..   Education Materials given:  Designer, industrial/product with food lists, sample meal pattern Sample menus Visit summary with goals/ instructions   Learner/ who was taught:  Patient   Level of understanding: Verbalizes/ demonstrates competency  Demonstrated degree of understanding via:   Teach back Learning barriers: None  Willingness to learn/ readiness for change: Eager, change in progress  Monitoring and Evaluation:  Dietary intake, exercise, and body weight      follow up:  03/06/24

## 2024-01-11 NOTE — Patient Instructions (Signed)
 Control portions of starches; keep to the size of a small fist or less.  Increase portions of low carb veggies, fill 1/2 the plate, and add protein and starch last.  Great job making lower sugar and lower fat choices most of the time, keep it up! Use high fiber foods like 100% whole grain bread/ rice/ crackers, beans, veggies, whole fruits -- they help with feeling full better and longer.  Add some walking or other physical activity for 15 minutes or more several days a week. Plan ahead for meals to work on healthy balance.

## 2024-01-30 IMAGING — MG MM DIGITAL SCREENING BILAT W/ TOMO AND CAD
6 of 10 series · 6 of 30 positions shown · non-contrast
Comparison: Previous exam(s).

CLINICAL DATA: Screening.

EXAM:
DIGITAL SCREENING BILATERAL MAMMOGRAM WITH TOMOSYNTHESIS AND CAD
TECHNIQUE: Bilateral screening digital craniocaudal and mediolateral oblique
mammograms were obtained. Bilateral screening digital breast
tomosynthesis was performed. The images were evaluated with
computer-aided detection.

[R MLO synth-2D]
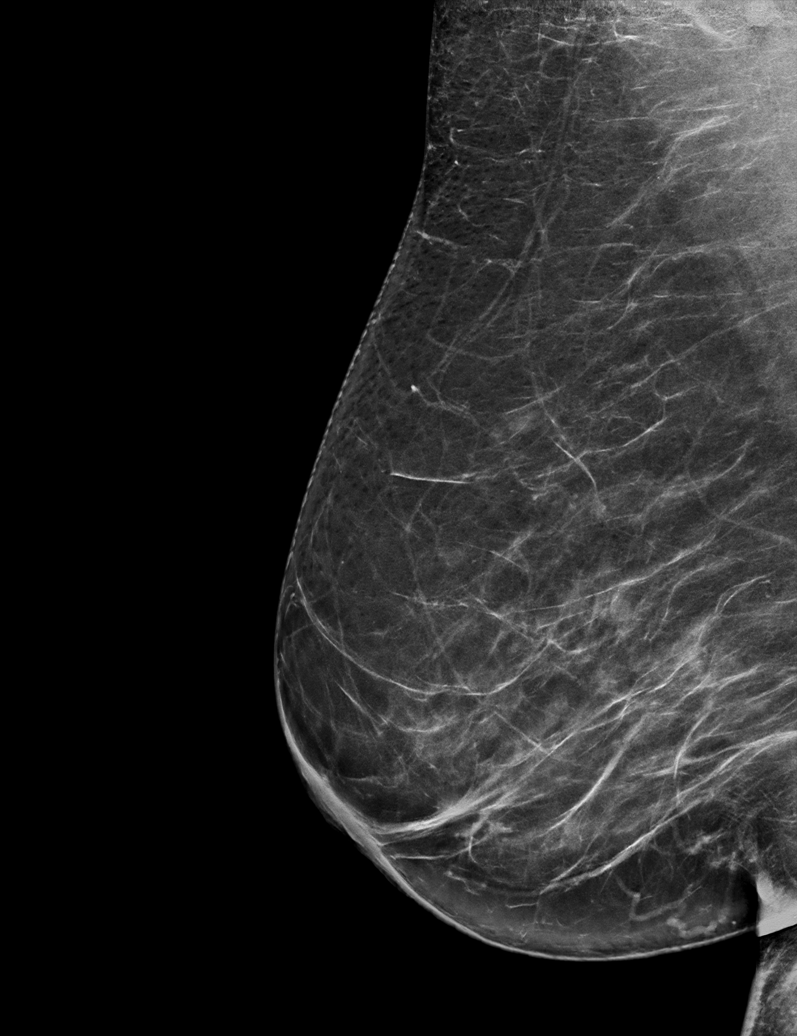

[L CC synth-2D]
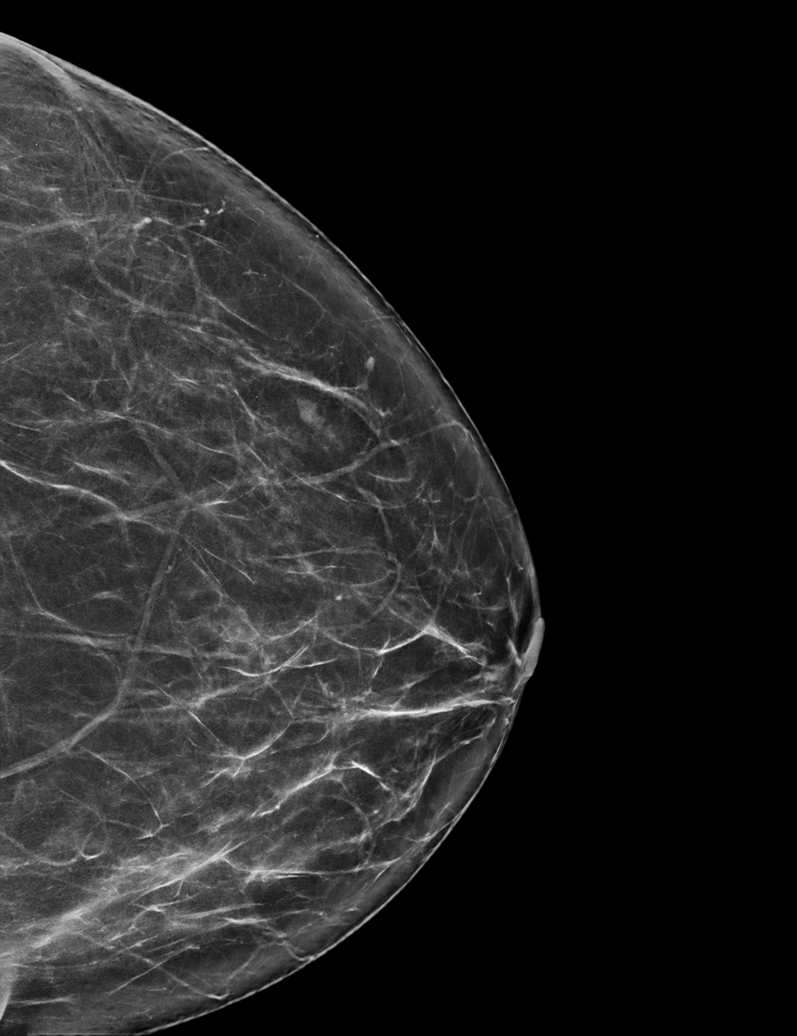

[L MLO synth-2D]
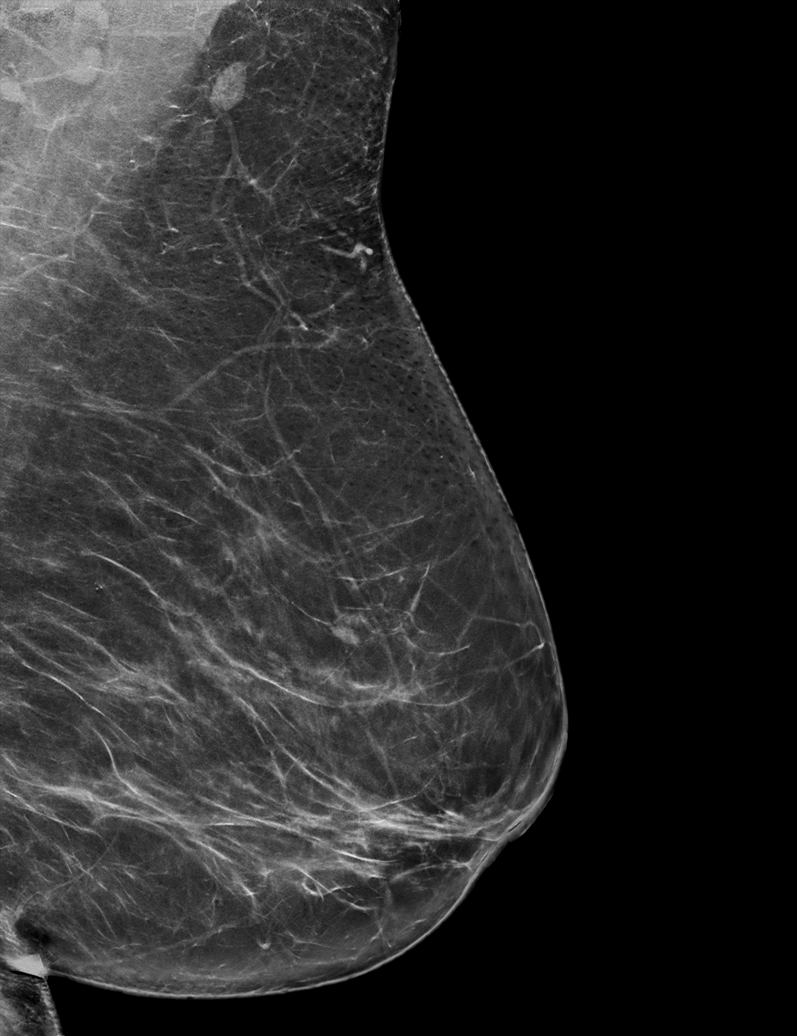

[R CC synth-2D]
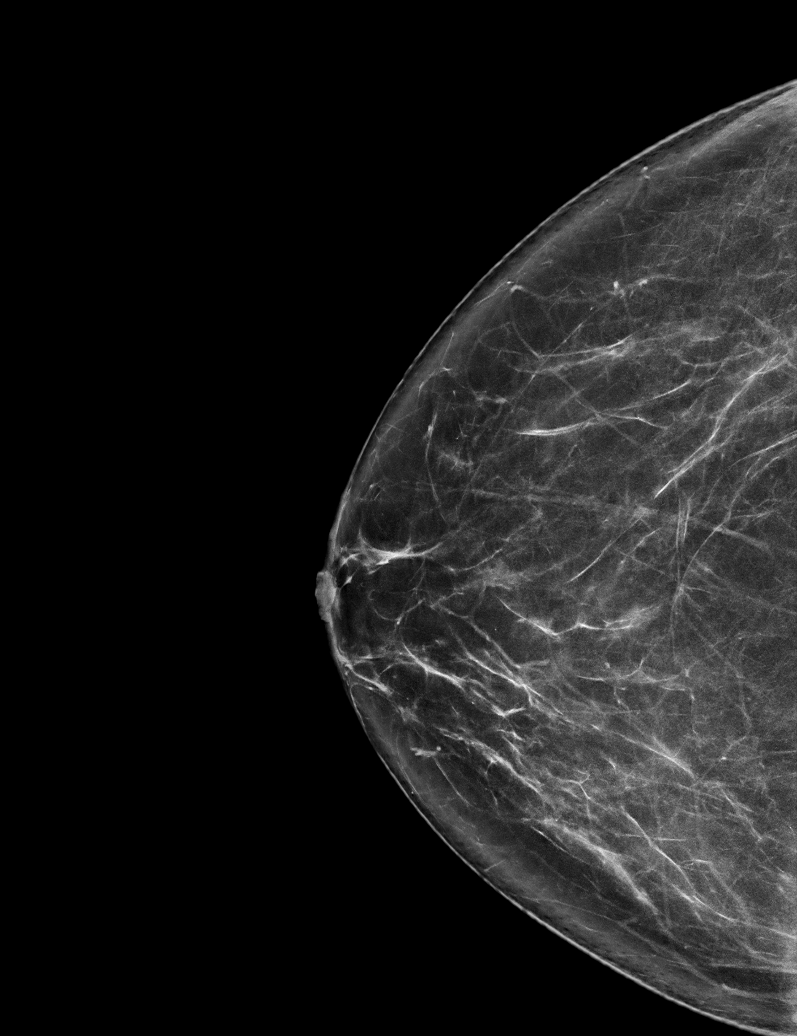

[L CV synth-2D]
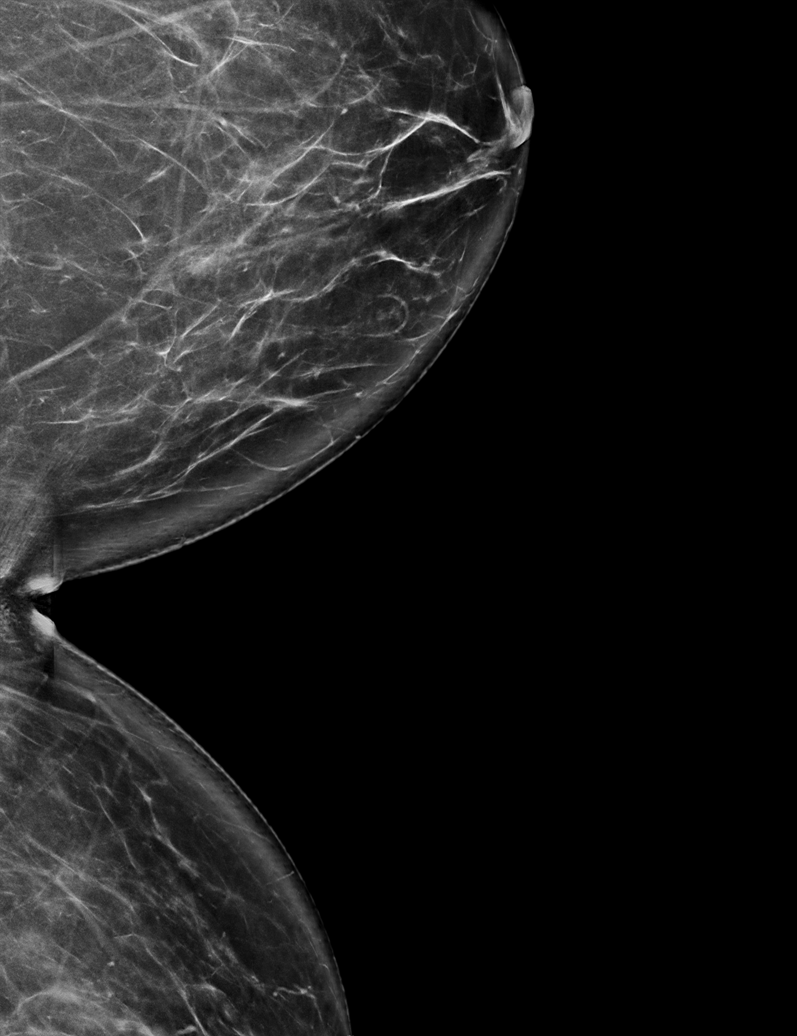

[R MLO tomo · tomo slice 38/75.0]
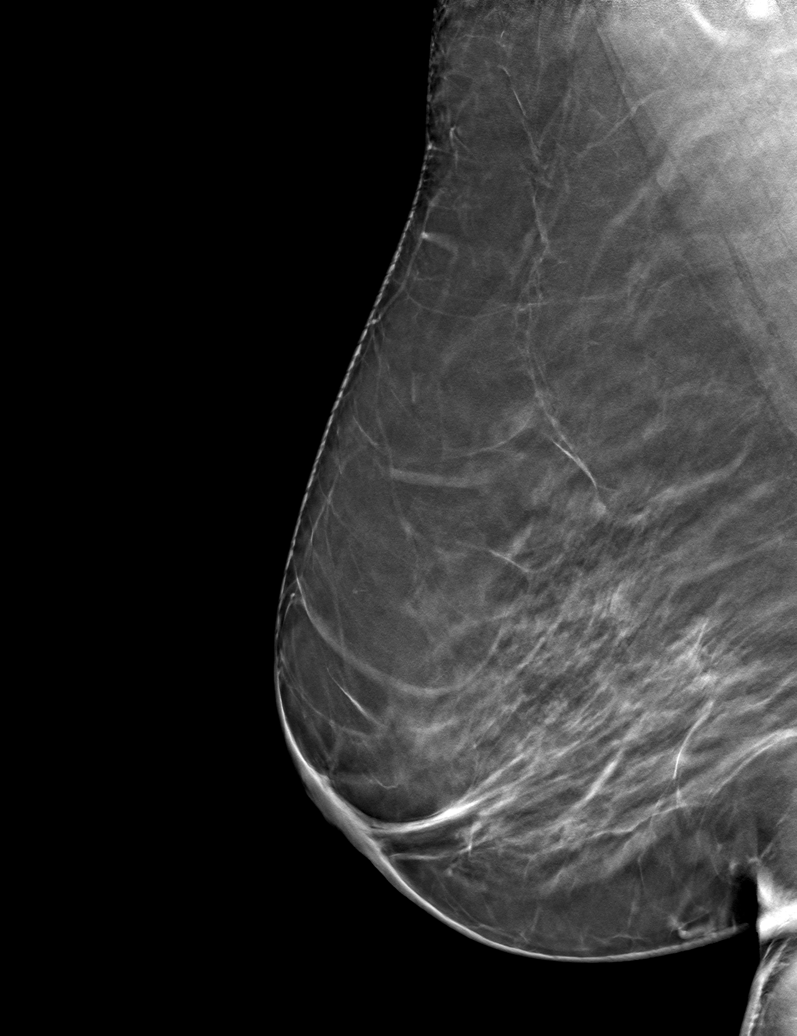

[6 of 30 positions shown; findings below may reference images not displayed]

ACR Breast Density Category b: There are scattered areas of
fibroglandular density.
FINDINGS: In the right breast, a possible asymmetry on the CC view warrants
further evaluation. In the left breast, no findings suspicious for
malignancy.
IMPRESSION: Further evaluation is suggested for possible asymmetry in the right
breast.

RECOMMENDATION:
Diagnostic mammogram and possibly ultrasound of the right breast.
(Code:[id number])

The patient will be contacted regarding the findings, and additional
imaging will be scheduled.

BI-RADS CATEGORY  0: Incomplete. Need additional imaging evaluation
and/or prior mammograms for comparison.

## 2024-03-06 ENCOUNTER — Encounter: Payer: Self-pay | Admitting: Dietician

## 2024-03-06 ENCOUNTER — Encounter: Attending: Family Medicine | Admitting: Dietician

## 2024-03-06 VITALS — Ht 65.0 in | Wt 169.2 lb

## 2024-03-06 DIAGNOSIS — E663 Overweight: Secondary | ICD-10-CM | POA: Insufficient documentation

## 2024-03-06 NOTE — Patient Instructions (Signed)
 Incorporate simple lunch options, healthy frozen meal + extra veggie or fruit; box-lunch style meal with a protein + a veggie, a fruit, and a starch.  Keep more fruits on hand to reduce sweets; avoid buying sweets most of the time.  Great job increasing activity, keep it up!

## 2024-03-06 NOTE — Progress Notes (Signed)
 Medical Nutrition Therapy: Visit start time: 0830  end time: 0900  Assessment:  Diagnosis: overweight Medical history changes: no changes Psychosocial issues/ stress concerns: none  Medications, supplement changes: no changes   Current weight: 169.2lbs Height: 5'5" BMI: 28.16  Progress and evaluation:  Patient reports some difficulty implementing changes, particularly in decrease intake of sweets, and consuming a balanced meal midday. She does have some cravings for sweets; also spouse eats sweets frequently. 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 Activity level has increased per patient, mostly with general daily activity She is considering joining weight watchers online program for help with structured meal planning.    Dietary Intake:  Usual eating pattern includes 2-3 meals and 1-2 snacks per day.  Breakfast Lunch/Snack: snacks on crackers, fruit, sometimes sweets Supper Beverages: water, some with sugar free flavoring, some 1/2-sweet tea  Physical activity: childcare   Intervention:   Nutrition Care Education:  Basic nutrition: reviewed appropriate nutrient balance; appropriate meal and snack schedule; general nutrition guidelines    Weight control: reviewed progress since previous visit; non-food factors that can influence weight; managing food cravings; strategies for making changes; portion control Advanced nutrition:  planning/ prepping for lunch meals  Other Intervention Notes: Patient is making some lifestyle changes; she is motivated to continue.  Updated goals for additional manageable changes.  No additional MNT follow up scheduled at this time; patient is strongly considering online program for weight loss.   Nutritional Diagnosis:  [place]-3.3 Overweight/obesity As related to history of excess calories; inadequate physical activity.  As  evidenced by patient with current BMI of 28, making diet changes to promote weight loss.    Education Materials given:  Scientist, product/process development Visit summary with goals/ instructions   Learner/ who was taught:  Patient   Level of understanding: Verbalizes/ demonstrates competency  Demonstrated degree of understanding via:   Teach back Learning barriers: None  Willingness to learn/ readiness for change: Eager, change in progress  Monitoring and Evaluation:  Dietary intake, exercise, and body weight      follow up: prn

## 2024-03-12 NOTE — Progress Notes (Signed)
 Established Patient Visit   Chief Complaint:  Chief Complaint  Patient presents with  . Dizziness    Occas   . Palpitations    Occas   . Left Sided Weakness    Last Monday a week ago patient had an episode where she didn't feel right in her head where she didn't feel right in her head and her vision was blurred and when she did stand up she stood more to the left    Date of Service: 03/12/2024 Date of Birth: Waibel 30, 1962 PCP: Valora Lynwood FALCON, MD   History of Present Illness: Ms. Kock is a 63 y.o.female who presents to clinic today for follow up. The patient was last seen by Tinnie Maiden, NP on 11/02/23. She has a past medical history significant for pSVT, PACs/PVCs, hypertension, hyperlipidemia. Since her last appointment the patient was seen by Urgent Care on 03/11/24 with concerns of left sided weakness, she was advised to follow up with our clinic. Also told at that time to start taking asa 81 mg.  She reports to clinic today stating she has been doing ok. Last Monday she had an episode of feeling strange in her head, with some mild confusion, and felt very weak on L side of body. Checked her HR on apple watch and this was in the 60s. Visual changes noted at that time with some blurriness. Episode lasted 20 minutes and then she felt better. No CP, SOB. Intermittent pulsating headaches over last month which last few minutes, does not usually get headaches. No prior episodes similar. No palpitations. No smoking history.   Few weeks ago, had an episode when she felt that her heart was going to stop. Has not noticed any HR lower than 60s on her apple watch. Episode only lasted a few seconds then resolved. No other episodes similar since that time. Does report fatigue since starting metoprolol at 50mg  bid.   She is feeling well today with no acute cardiac complaints. Main concern was these two episodes she has had over the last month.   Past Medical and Surgical History  Past Medical History:  Past  Medical History:  Diagnosis Date  . Allergic state   . Anemia   . Anxiety   . Chickenpox   . Depression   . Diverticulitis   . Diverticulosis   . Genital herpes    for which she takes Valtrex prn  . Hemorrhoids    intermittently  . History of stress test 2013?   ETT   . Hyperlipidemia   . Hypertension   . Positive ANA (antinuclear antibody) 11/29/2016   Ana 1/80 speckled   . Psoriasis     Past Surgical History: She  has a past surgical history that includes Hysterectomy (1988); Colonoscopy (03/07/2013); Colonoscopy (10/09/2018); Dilation and curettage of uterus; Cataract extraction; and Tubal ligation (1986).   Medications and Allergies  Current Medications:  Current Outpatient Medications on File Prior to Visit  Medication Sig Dispense Refill  . ALPRAZolam (XANAX) 0.5 MG tablet TAKE 1/2 TO 1 TABLET BY MOUTH TWICE A DAY 30 tablet 2  . amLODIPine (NORVASC) 10 MG tablet Take 1 tablet (10 mg total) by mouth once daily 90 tablet 3  . aspirin 81 MG EC tablet Take 81 mg by mouth once daily    . atorvastatin (LIPITOR) 40 MG tablet TAKE 1 TABLET BY MOUTH EVERY DAY 90 tablet 3  . calcium carb-D3-mag cmb11-zinc 333 mg-200 unit -133 mg-5 mg Tab Take 1 tablet by mouth once  daily    . docosahexaenoic acid/epa (FISH OIL ORAL) Take 1 tablet by mouth once daily    . estradioL (ESTRACE) 1 MG tablet TAKE 1 TABLET BY MOUTH EVERY DAY 90 tablet 3  . pantoprazole (PROTONIX) 40 MG DR tablet Take 1 tablet (40 mg total) by mouth once daily 30 tablet 11  . venlafaxine (EFFEXOR-XR) 150 MG XR capsule TAKE 1 CAPSULE BY MOUTH ONCE DAILY 90 capsule 3   No current facility-administered medications on file prior to visit.    Allergies: Latex, natural rubber  Social and Family History  Social History:   reports that she has never smoked. She has never been exposed to tobacco smoke. She has never used smokeless tobacco. She reports that she does not drink alcohol and does not use drugs.  Family History:   Family History  Problem Relation Name Age of Onset  . High blood pressure (Hypertension) Mother IDA DODD   . Allergic rhinitis Mother IDA DODD   . Alzheimer's disease Mother IDA DODD 29  . Glaucoma Mother IDA DODD   . Colon polyps Mother IDA DODD   . Anesthesia problems Mother IDA DODD   . Hip fracture Mother IDA DODD   . Melanoma Mother IDA DODD   . Osteoporosis (Thinning of bones) Mother IDA DODD   . Skin cancer Mother IDA DODD   . Cancer Mother IDA DODD        CERVICAL CANCER  . High blood pressure (Hypertension) Father CLAUDE DODD   . Stroke Father CLAUDE DODD   . Diabetes type II Father CLAUDE DODD   . Colon cancer Father CLAUDE DODD   . Diabetes Father CLAUDE DODD   . High blood pressure (Hypertension) Sister Jon Potters   . Melanoma Sister Jon Potters   . Skin cancer Sister Jon Potters   . Macular degeneration Neg Hx      Review of Systems  Review of Systems:  Positive for none Negative for weight gain weight loss, weakness, vision change, hearing loss, cough, congestion, PND, orthopnea, heartburn, nausea, diaphoresis, vomiting, diarrhea, bloody stool, melena, stomach pain, extremity pain, leg weakness, leg cramping, leg blood clots, headache, blackouts, nosebleed, trouble swallowing, mouth pain, urinary frequency, urination at night, muscle weakness, skin lesions, skin rashes, tingling ,ulcers, numbness, anxiety, and/or depression  Physical Examination  Vitals: BP (!) 140/90 (BP Location: Left upper arm, Patient Position: Sitting, BP Cuff Size: Adult)   Pulse 79   Resp 14   Ht 165.1 cm (5' 5)   Wt 77 kg (169 lb 12.8 oz)   SpO2 95%   BMI 28.26 kg/m  Ht: 165.1 cm (5' 5) Wt: 77 kg (169 lb 12.8 oz) BSA: Body surface area is 1.88 meters squared. Body mass index is 28.26 kg/m.  Appearance: Well appearing, no acute distress. HEENT: Pupils equally reactive to light and accomodation, no apparent xanthelasma or other lesions. Neck: Supple without apparent masses or  lymphadenopathy. Lungs: Normal respiratory effort. No wheezes, rales, or rhonchi. Heart: Regular rate and rhythm. Normal S1, S2. No gallops, murmurs, or rub. PMI normal size and placement. Carotid upstroke normal without bruit. Jugular venous pressure is normal. Abdomen: Soft, nontender, non-distended, with normal bowel sounds. Abdominal aorta is normal size without bruit. No apparent masses. Extremities: No edema, no cyanosis, no clubbing, no ulcers. Peripheral Pulses: 2+ in all extremities, 2+ femoral pulses bilaterally, 2+ DP pulses. Neurological: Alert and oriented to time, place, and person.  Assessment   63 y.o. female with  Encounter Diagnoses  Name Primary?  . Left-sided weakness Yes  . SVT (supraventricular tachycardia) (CMS/HHS-HCC)   . Heart palpitations   . Primary hypertension   . Mixed hyperlipidemia   . Sinus tachycardia   . Ventricular ectopy     Plan   Left-sided weakness -Continue aspirin 81 mg daily as started yesterday by urgent care. -CT Head ordered, patient scheduled to see neurology on 04/04/24.  -Will place 15 day holter today for further evaluation -Echo ordered, last done in 2019 with normal EF and trivial MR and TR at that time. -Patient advised to go to ED if she has any recurrence of symptoms.  Paroxysmal SVT, palpitations -EKG today with NSR, rate 72 bpm without any acute changes. -Decrease metoprolol tartrate to 25 mg bid.  -See plan above for monitor.   Hypertension -BP today slightly elevated today. Could consider addition of ARB at future appointment.  -Continue amlodipine 10 mg daily, and metoprolol as above  Hyperlipidemia -Continue atorvastatin 40 mg daily.  Orders Placed This Encounter  Procedures  . CT brain without contrast  . CARD holter monitoring > 7 days to 15 days - hook up  . ECG 12-lead  . Echo complete    Return for As scheduled with Lauren.   Attestation Statement:   I personally performed the service,  non-incident to. (WP)   CARALYN REBEKAH HUDSON, PA    Caralyn Pleasant Bloch, [place]  [facility] 03/12/2024

## 2024-03-13 ENCOUNTER — Other Ambulatory Visit: Payer: Self-pay | Admitting: Student

## 2024-03-13 DIAGNOSIS — R531 Weakness: Secondary | ICD-10-CM

## 2024-03-28 ENCOUNTER — Ambulatory Visit: Admission: RE | Admit: 2024-03-28 | Source: Ambulatory Visit

## 2024-04-02 ENCOUNTER — Ambulatory Visit
Admission: RE | Admit: 2024-04-02 | Discharge: 2024-04-02 | Disposition: A | Discharge status: Home / Self Care | Source: Ambulatory Visit | Attending: Student | Admitting: Student

## 2024-04-02 DIAGNOSIS — R531 Weakness: Secondary | ICD-10-CM | POA: Insufficient documentation

## 2024-04-09 ENCOUNTER — Other Ambulatory Visit: Payer: Self-pay | Admitting: Neurology

## 2024-04-09 DIAGNOSIS — G459 Transient cerebral ischemic attack, unspecified: Secondary | ICD-10-CM

## 2024-04-11 ENCOUNTER — Ambulatory Visit
Admission: RE | Admit: 2024-04-11 | Discharge: 2024-04-11 | Disposition: A | Discharge status: Home / Self Care | Source: Ambulatory Visit | Attending: Neurology | Admitting: Neurology

## 2024-04-11 DIAGNOSIS — G459 Transient cerebral ischemic attack, unspecified: Secondary | ICD-10-CM

## 2024-05-31 ENCOUNTER — Ambulatory Visit: Payer: Self-pay

## 2024-05-31 DIAGNOSIS — Z800 Family history of malignant neoplasm of digestive organs: Secondary | ICD-10-CM | POA: Diagnosis not present

## 2024-05-31 DIAGNOSIS — Z1211 Encounter for screening for malignant neoplasm of colon: Secondary | ICD-10-CM | POA: Diagnosis present

## 2024-05-31 DIAGNOSIS — K219 Gastro-esophageal reflux disease without esophagitis: Secondary | ICD-10-CM | POA: Diagnosis not present

## 2024-05-31 DIAGNOSIS — Q438 Other specified congenital malformations of intestine: Secondary | ICD-10-CM | POA: Diagnosis not present

## 2024-05-31 DIAGNOSIS — R1319 Other dysphagia: Secondary | ICD-10-CM | POA: Diagnosis not present
